# Patient Record
Sex: Female | Born: 1975 | Race: Black or African American | Hispanic: No | Marital: Single | Smoking: Never smoker
Health system: Southern US, Community
[De-identification: ages and names within clinical notes are randomized; demographics above are authoritative.]

---

## 1998-01-03 ENCOUNTER — Encounter (HOSPITAL_COMMUNITY): Admission: RE | Admit: 1998-01-03 | Discharge: 1998-04-03 | Payer: Self-pay

## 1999-05-27 ENCOUNTER — Ambulatory Visit (HOSPITAL_COMMUNITY): Admission: RE | Admit: 1999-05-27 | Discharge: 1999-05-27 | Payer: Self-pay | Admitting: Obstetrics and Gynecology

## 1999-05-27 ENCOUNTER — Encounter: Payer: Self-pay | Admitting: Obstetrics and Gynecology

## 2000-01-16 ENCOUNTER — Other Ambulatory Visit: Admission: RE | Admit: 2000-01-16 | Discharge: 2000-01-16 | Payer: Self-pay | Admitting: Obstetrics and Gynecology

## 2000-03-16 ENCOUNTER — Ambulatory Visit (HOSPITAL_COMMUNITY): Admission: RE | Admit: 2000-03-16 | Discharge: 2000-03-16 | Payer: Self-pay | Admitting: Obstetrics and Gynecology

## 2000-03-16 ENCOUNTER — Encounter: Payer: Self-pay | Admitting: *Deleted

## 2000-03-19 ENCOUNTER — Ambulatory Visit (HOSPITAL_COMMUNITY): Admission: RE | Admit: 2000-03-19 | Discharge: 2000-03-19 | Payer: Self-pay | Admitting: Obstetrics and Gynecology

## 2000-05-31 ENCOUNTER — Encounter (INDEPENDENT_AMBULATORY_CARE_PROVIDER_SITE_OTHER): Payer: Self-pay

## 2000-05-31 ENCOUNTER — Inpatient Hospital Stay (HOSPITAL_COMMUNITY): Admission: AD | Admit: 2000-05-31 | Discharge: 2000-06-06 | Payer: Self-pay | Admitting: Obstetrics and Gynecology

## 2000-06-01 ENCOUNTER — Encounter: Payer: Self-pay | Admitting: Obstetrics and Gynecology

## 2000-06-02 ENCOUNTER — Encounter: Payer: Self-pay | Admitting: Obstetrics and Gynecology

## 2000-06-07 ENCOUNTER — Encounter: Admission: RE | Admit: 2000-06-07 | Discharge: 2000-08-23 | Payer: Self-pay | Admitting: Obstetrics and Gynecology

## 2000-10-07 ENCOUNTER — Ambulatory Visit (HOSPITAL_COMMUNITY): Admission: RE | Admit: 2000-10-07 | Discharge: 2000-10-07 | Payer: Self-pay | Admitting: Obstetrics and Gynecology

## 2000-10-07 ENCOUNTER — Encounter: Payer: Self-pay | Admitting: Obstetrics and Gynecology

## 2002-03-09 ENCOUNTER — Other Ambulatory Visit: Admission: RE | Admit: 2002-03-09 | Discharge: 2002-03-09 | Payer: Self-pay | Admitting: Obstetrics and Gynecology

## 2003-10-18 ENCOUNTER — Other Ambulatory Visit: Admission: RE | Admit: 2003-10-18 | Discharge: 2003-10-18 | Payer: Self-pay | Admitting: Obstetrics and Gynecology

## 2005-01-23 ENCOUNTER — Other Ambulatory Visit: Admission: RE | Admit: 2005-01-23 | Discharge: 2005-01-23 | Payer: Self-pay | Admitting: Obstetrics and Gynecology

## 2006-03-03 ENCOUNTER — Other Ambulatory Visit: Admission: RE | Admit: 2006-03-03 | Discharge: 2006-03-03 | Payer: Self-pay | Admitting: Obstetrics and Gynecology

## 2006-09-09 ENCOUNTER — Emergency Department (HOSPITAL_COMMUNITY): Admission: EM | Admit: 2006-09-09 | Discharge: 2006-09-09 | Payer: Self-pay | Admitting: Family Medicine

## 2007-06-03 ENCOUNTER — Emergency Department (HOSPITAL_COMMUNITY): Admission: EM | Admit: 2007-06-03 | Discharge: 2007-06-03 | Payer: Self-pay | Admitting: Emergency Medicine

## 2007-07-19 ENCOUNTER — Emergency Department (HOSPITAL_COMMUNITY): Admission: EM | Admit: 2007-07-19 | Discharge: 2007-07-19 | Payer: Self-pay | Admitting: Family Medicine

## 2008-03-02 ENCOUNTER — Emergency Department (HOSPITAL_COMMUNITY): Admission: EM | Admit: 2008-03-02 | Discharge: 2008-03-02 | Payer: Self-pay | Admitting: Family Medicine

## 2009-09-30 ENCOUNTER — Ambulatory Visit (HOSPITAL_COMMUNITY): Admission: RE | Admit: 2009-09-30 | Discharge: 2009-09-30 | Payer: Self-pay | Admitting: Obstetrics and Gynecology

## 2010-01-15 ENCOUNTER — Encounter: Admission: RE | Admit: 2010-01-15 | Discharge: 2010-01-15 | Payer: Self-pay | Admitting: Obstetrics and Gynecology

## 2010-02-08 ENCOUNTER — Emergency Department (HOSPITAL_COMMUNITY): Admission: EM | Admit: 2010-02-08 | Discharge: 2010-02-08 | Payer: Self-pay | Admitting: Family Medicine

## 2010-03-18 ENCOUNTER — Other Ambulatory Visit: Payer: Self-pay | Admitting: Obstetrics and Gynecology

## 2010-03-19 ENCOUNTER — Encounter (INDEPENDENT_AMBULATORY_CARE_PROVIDER_SITE_OTHER): Payer: Self-pay | Admitting: Obstetrics and Gynecology

## 2010-03-19 ENCOUNTER — Inpatient Hospital Stay (HOSPITAL_COMMUNITY): Admission: AD | Admit: 2010-03-19 | Discharge: 2010-03-22 | Payer: Self-pay | Admitting: Obstetrics and Gynecology

## 2010-03-22 ENCOUNTER — Encounter: Admission: RE | Admit: 2010-03-22 | Discharge: 2010-04-21 | Payer: Self-pay | Admitting: Obstetrics and Gynecology

## 2010-04-01 ENCOUNTER — Ambulatory Visit: Admission: RE | Admit: 2010-04-01 | Discharge: 2010-04-01 | Payer: Self-pay | Admitting: Obstetrics and Gynecology

## 2010-04-22 ENCOUNTER — Encounter: Admission: RE | Admit: 2010-04-22 | Discharge: 2010-05-06 | Payer: Self-pay | Admitting: Obstetrics and Gynecology

## 2010-05-23 ENCOUNTER — Encounter: Admission: RE | Admit: 2010-05-23 | Discharge: 2010-06-22 | Payer: Self-pay | Admitting: Obstetrics and Gynecology

## 2010-06-23 ENCOUNTER — Encounter
Admission: RE | Admit: 2010-06-23 | Discharge: 2010-07-23 | Payer: Self-pay | Source: Home / Self Care | Admitting: Obstetrics and Gynecology

## 2010-07-24 ENCOUNTER — Encounter
Admission: RE | Admit: 2010-07-24 | Discharge: 2010-08-23 | Payer: Self-pay | Source: Home / Self Care | Attending: Obstetrics and Gynecology | Admitting: Obstetrics and Gynecology

## 2010-08-24 ENCOUNTER — Encounter
Admission: RE | Admit: 2010-08-24 | Discharge: 2010-09-16 | Payer: Self-pay | Source: Home / Self Care | Attending: Obstetrics and Gynecology | Admitting: Obstetrics and Gynecology

## 2010-09-24 ENCOUNTER — Encounter (HOSPITAL_COMMUNITY)
Admission: RE | Admit: 2010-09-24 | Discharge: 2010-09-24 | Disposition: A | Discharge status: Home / Self Care | Payer: Self-pay | Source: Ambulatory Visit | Attending: Obstetrics and Gynecology | Admitting: Obstetrics and Gynecology

## 2010-09-24 DIAGNOSIS — O923 Agalactia: Secondary | ICD-10-CM | POA: Insufficient documentation

## 2010-10-31 LAB — SURGICAL PCR SCREEN
MRSA, PCR: NEGATIVE
Staphylococcus aureus: POSITIVE — AB

## 2010-10-31 LAB — CBC
HCT: 32.3 % — ABNORMAL LOW (ref 36.0–46.0)
HCT: 42.1 % (ref 36.0–46.0)
HCT: 44.2 % (ref 36.0–46.0)
Hemoglobin: 10.8 g/dL — ABNORMAL LOW (ref 12.0–15.0)
Hemoglobin: 14.5 g/dL (ref 12.0–15.0)
MCHC: 32.8 g/dL (ref 30.0–36.0)
MCV: 90.6 fL (ref 78.0–100.0)
MCV: 90.7 fL (ref 78.0–100.0)
RBC: 4.64 MIL/uL (ref 3.87–5.11)
RDW: 15.2 % (ref 11.5–15.5)
RDW: 15.7 % — ABNORMAL HIGH (ref 11.5–15.5)
WBC: 5.1 10*3/uL (ref 4.0–10.5)
WBC: 5.7 10*3/uL (ref 4.0–10.5)
WBC: 8.4 10*3/uL (ref 4.0–10.5)

## 2010-10-31 LAB — BASIC METABOLIC PANEL
Chloride: 110 mEq/L (ref 96–112)
GFR calc Af Amer: >60 mL/min (ref >60)
GFR calc non Af Amer: >60 mL/min (ref >60)
Potassium: 3.7 mEq/L (ref 3.5–5.1)
Sodium: 136 mEq/L (ref 135–145)

## 2010-10-31 LAB — TYPE AND SCREEN
ABO/RH(D): B POS
Antibody Screen: NEGATIVE

## 2010-10-31 LAB — GLUCOSE, CAPILLARY
Glucose-Capillary: 72 mg/dL (ref 70–99)
Glucose-Capillary: 73 mg/dL (ref 70–99)

## 2010-10-31 LAB — RPR: RPR Ser Ql: NONREACTIVE

## 2010-11-05 LAB — CBC
Hemoglobin: 13.6 g/dL (ref 12.0–15.0)
MCHC: 32.3 g/dL (ref 30.0–36.0)
MCV: 90.7 fL (ref 78.0–100.0)
RBC: 4.65 MIL/uL (ref 3.87–5.11)
RDW: 15.6 % — ABNORMAL HIGH (ref 11.5–15.5)

## 2010-11-25 ENCOUNTER — Encounter (HOSPITAL_COMMUNITY)
Admission: RE | Admit: 2010-11-25 | Discharge: 2010-11-25 | Disposition: A | Discharge status: Home / Self Care | Payer: Self-pay | Source: Ambulatory Visit | Attending: Obstetrics and Gynecology | Admitting: Obstetrics and Gynecology

## 2010-11-25 DIAGNOSIS — O923 Agalactia: Secondary | ICD-10-CM | POA: Insufficient documentation

## 2011-01-02 NOTE — H&P (Signed)
[facility]  Patient:    Brittany Hinton, Brittany Hinton                        MRN: [id number] Adm. Date:  03/19/00 Attending:  Alvino Chapel, M.D.                         History and Physical  HISTORY OF PRESENT ILLNESS:   The patient is a 35 year old, G1, P0, at [redacted] weeks gestation, who is scheduled for a cervical cerclage given findings of a funneling cervix on sonogram and a vaginal examination consistent with a mildly incompetent cervix.  The patient began prenatal care at eight weeks gestation and had an uncomplicated pregnancy course that is essential this time except for a abnormal alpha-fetoprotein which had a slightly increased risk of Down syndrome.  PERTINENT LABORATORY DATA:    B positive.  Antibody negative.  RPR nonreactive. Rubella-immune.  Hepatitis B surface antigen negative.  HIV negative.  GC negative.  Chlamydia negative.  PAST MEDICAL HISTORY:         Significant only for a history of depression which requires no medications at this point.  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  PAST GYNECOLOGIC HISTORY:     Noncontributory.  PAST OBSTETRICAL HISTORY:     None.  PAST SURGICAL HISTORY:        None.  ALLERGIES:                    The patient has no known drug allergies.  CURRENT MEDICATIONS:          She is currently on her prenatal vitamins only.  PHYSICAL EXAMINATION:  VITAL SIGNS:                  On physical examination the patient is 251 pounds, blood pressure is 100/70.  HEART:                        Regular rate and rhythm.  Fundal height is consistent with [redacted] weeks gestation on sonogram.  PELVIC:                       Again, the patients cervix is 4.5 cm which funnels to the external os with pressure.  On vaginal examination the cervix is approximately 2 cm long with a fibroid uterine segment.  DISPOSITION:                  The patient was counseled for the  possible incompetent cervix and options were discussed including observation versus cervical cerclage.  The risks and benefits of the cerclage were discussed with patient including possible rupture of membranes and the patient agrees to proceed with the procedure. DD:  03/18/00 TD:  03/18/00 Job: [id number]

## 2011-01-02 NOTE — Discharge Summary (Signed)
[facility]  Patient:    Brittany Hinton, Brittany Hinton                      MRN: [id number] Adm. Date:  [id number] Disc. Date: [id number] Attending:  Michaele Offer                           Discharge Summary  DISCHARGE DIAGNOSES:          1. Preterm pregnancy at 29 weeks, delivered.                               2. Severe preeclampsia.                               3. Nonreassuring fetal status.                               4. Status post primary low transverse cesarean                                  section that was not in the lower uterine                                  segment.  DISCHARGE MEDICATIONS:        1. Percocet one to two tablets p.o. every four                                  hours p.r.n.                               2. Motrin 600 mg p.o. every six hours p.r.n.  DISCHARGE FOLLOW-UP:          The patient is to follow up in our office in approximately two weeks for an incision check.  In addition, she was given consultations from NICU social service regarding NICU visits for her baby, as well as instruction on pumping by Advertising copywriter.  HISTORY OF PRESENT ILLNESS:   The patient is a 35 year old, G1, P0, who was admitted at 28+ weeks by an 8-week ultrasound after she was seen in the office for a scheduled visit and complained of mild headache, nausea, and increased edema.  The patient reported that she had not felt very well for approximately one week, however, denied specific symptoms of right upper quadrant or epigastric pain.  She had good fetal movement.  No vaginal bleeding or rupture of membranes.  The blood pressure in the office was 140-160/110.  She had 3+ proteinuria and a 17-pound weight gain in two weeks.  Prenatal care had been previously complicated by a 1:229 risk of trisomy 17.  The patient declined amniocentesis with a normal ultrasound.  She was also noted to have a possible incompetent cervix early in pregnancy  by 16-17 week ultrasound, which indeed on palpation felt quite effaced.  Therefore, she was treated with a cerclage which was placed at 18 weeks.  PRENATAL LABORATORIES:  B+.  Sickle negative.  RPR nonreactive.  Rubella immune.  Hepatitis B surface antigen negative.  HIV negative.  GC negative. Chlamydia negative.  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  She has had no recent chest x-ray.  History of depression.  PAST SURGICAL HISTORY:        The patient had a cerclage placed as stated previously.  PAST OBSTETRICAL HISTORY:     None.  PAST GYNECOLOGICAL HISTORY:   None.  ALLERGIES:                    She has no known drug allergies.  MEDICATIONS:                  She was on no medications.  SOCIAL SITUATION:             The patient is single, however, the father of the baby was involved and living in [place], [place].  She is not a smoker.  PHYSICAL EXAMINATION ON ADMISSION:                    The patients blood pressure was 140-160/80-110. Her other vital signs were stable.  The fetal heart rate had been difficult to follow, however, did appear to have good variability with occasional accelerations.  HEART:                        Regular rate and rhythm.  LUNGS:                        Clear.  ABDOMEN:                      Gravid and nontender.  The fundal height was 31 cm.  EXTREMITIES:                  She had 3+ edema.  DTRs were 3/4 with no clonus.  PELVIC:                       On vaginal exam, her sutures were intact in the cervix with os closed.  LABORATORY DATA:              Her preeclamptic labs on admission were normal. The hematocrit was 38.6 and platelets were 368.  The SGOT was 30 and the SGPT was 26.  Her uric acid was abnormal at 8.5.  Her creatinine was slightly elevated at 1.0  HOSPITAL COURSE:              The patient was admitted for observation and betamethasone.  She received  betamethasone and two doses 24 hours apart and began a 24-hour urine.  She remained on continuous monitoring.  The plan was also  made to have an ultrasound to rule out IUGR and oligohydramnios.  On the evening of admission, the patient was noted to have a baseline fetal heart rate of approximately 150 with good variability.  There were no accelerations or significant decelerations, however, the baby did have some occasional variable decelerations previous to her examination that evening.  The variability remained reassuring.  Therefore, the patient was continued on an observation status.  On hospital day #2, the patient remained stable with no significant preeclamptic symptoms.  The fetal heart rate remained much unchanged with  good variability and occasional variable decelerations.  Her labs were unchanged with an increased creatinine and uric acid.  Blood pressures were stable after admission from 130-140/70-80 on bed rest.  The patient did have a noted low urine output, which had responded partially to boluses.  As stated, she had little p.o. intake for the previous 48 hours prior to admission.  Therefore, she was continued to be observed with an increase in her fluid and change of her IV fluids to D5 1/2 normal saline.  On the evening of hospital day #2, blood pressures remained stable at 114-140/70-74.  The fetal heart rate continued to have good variability with only occasional variable decelerations.  The blood pressure was stable.  The patient received her second betamethasone dose.  Urine output remained diminished at approximately 80 cc for five hours.  A Foley catheter was then placed and the patient was given a 500 cc bolus.  An ultrasound on this day revealed a fetus that was questionably growth restricted, however, had normal Dopplers and normal amniotic fluid.  The patient was begun on magnesium sulfate after her diminished urine output considered to confirm  pre-eclamptic diagnosis.  Urine output responded well to bolus with adequate output in the 40-50 cc range an hour.  The patient was given a magnesium bolus and the plan  was made to check a magnesium level four hours after bolus given her decreased urine output.  The decision was made with the patient that should her urine output drop again we would proceed with labor induction versus cesarean section.  The fetal heart rate remained relatively reassuring with good variability at this point.  On hospital day #3, the patient remained stable with adequate urine output.  Her labs were essentially unchanged.  In the p.m. of that evening, the patient reported decreased fetal movement and the fetal heart rate was examined with decrease in variability noted and occasional decelerations.  A fetal acoustic stem was provided and the patient was noted to have no response with the fetal heart acceleration at this point.  After careful discussion with the patient and the father of the baby, the preeclamptic diagnosis was discussed.  At this point, the baby had received 24 hours of betamethasone x 2 doses.  Labs revealed a 24-hour urine protein of 17 g.  Therefore, the decision was made to proceed with delivery.  Sutures were removed from the cervix without difficulty and the vaginal exam at this time remained closed, 2 cm, and high.  Given the relative inability to confirm fetal well being, the decision was made to proceed with a cesarean section versus any attempt at labor induction.  The patient underwent a primary transverse cesarean section which was higher than the lower uterine segment. She was delivered of a viable female infant.  Apgars were 1, 5, and 7.  Weight was 873 g.  The baby was taken to neonatal intensive care for premature status.  The patient tolerated the cesarean section and was admitted for routine postpartum care.  She was continued on her magnesium as her urine output was  continually sluggish.  Her postoperative hematocrit was 35.6. Other preeclamptic labs remained stable.  Blood pressures were 130s-150s/60-91.  On postoperative day #2, felt much better.  She had some blurry vision, but otherwise no other preeclamptic symptoms.  She had no nausea and vomiting, was passing flatus, and was able to tolerate a regular diet.  Blood pressures remained slightly borderline at 150-169/75-96.  She had begun to have  significant diuresis with approximately 2 L in eight hours. Therefore, the decision was made to discontinue her magnesium as she was greater than 24 hours from delivery and diuresing well.  She continued her postoperative recovery without difficulty.  She began breast pumping with the assistance of lactation consultants.  Her blood pressure remained borderline at most with systolics in the 140s-150s over diastolics in the 70s to low 90s. The patients incision was well approximated with Steri-Strips and had no erythema or exudate.  On postoperative day #4, she was discharged to home to follow up in approximately two weeks for an incision check. DD:  06/26/00 TD:  06/27/00 Job: [id number]

## 2011-01-02 NOTE — Op Note (Signed)
[facility]  Patient:    Brittany Hinton, Brittany Hinton                      MRN: [id number] Proc. Date: 03/19/00 Adm. Date:  [id number] Disc. Date: [id number] Attending:  Oliver Pila                           Operative Report  PREOPERATIVE DIAGNOSIS: 1. Incompetent cervix. 2. Intrauterine gestation at approximately [redacted] weeks gestation.  POSTOPERATIVE DIAGNOSIS: 1. Incompetent cervix. 2. Intrauterine gestation at approximately [redacted] weeks gestation.  OPERATION:  McDonald cerclage x 2.  SURGEON:  Alvino Chapel, M.D.  ASSISTANT:  Zenaida Niece, M.D.  ANESTHESIA:  Spinal.  ESTIMATED BLOOD LOSS:  50 cc.  URINE OUTPUT:  Approximately 50 cc straight cathed prior to procedure and IV fluids approximately 600 cc lactated Ringers.  DESCRIPTION OF PROCEDURE:  The patient was taken to the operating room where spinal anesthesia was obtained without difficulty.  She was then prepped and draped in normal sterile fashion in the dorsal supine position.  A weighted speculum was then placed in the vagina and the cervix identified with the anterior lip grasped with a ring forcep and at 12 oclock position, the #2 Polydek suture was used in a clockwise fashion to suture around the cervix circumferentially.  This was brought back around clockwise to the 12 oclock position and then tied down with good results.  With gentle traction placed on this suture, a second McDonald cerclage was placed approximately 1 cm back on the cervix in a similar fashion beginning at 12 oclock and carried around in a clockwise fashion.  This was also tied down with good result.  There was a small amount of bleeding which was stopped after the suture was tied down. There was no leakage of fluid noted at this point and the patient was therefore taken out of dorsal lithotomy position and all instruments were removed from the vagina and the patient was taken to the recovery room in stable  condition. DD:  03/19/00 TD:  03/22/00 Job: [id number]

## 2011-01-02 NOTE — Op Note (Signed)
[facility]  Patient:    Brittany Hinton, Brittany Hinton                      MRN: [id number] Proc. Date: 06/02/00 Adm. Date:  [id number] Attending:  Michaele Offer                           Operative Report  PREOPERATIVE DIAGNOSES:       1. Intrauterine pregnancy at 29 weeks.                               2. Severe preeclampsia.                               3. Nonreassuring fetal status.  POSTOPERATIVE DIAGNOSES:      1. Intrauterine pregnancy at 29 weeks.                               2. Severe preeclampsia.                               3. Nonreassuring fetal status.                               4. Intrauterine growth restriction.  OPERATION:                    Primary transverse cesarean section that was                               not in the lower uterine segment.  SURGEON:                      Zenaida Niece, M.D.  ASSISTANT:  ANESTHESIA:                   Spinal anesthesia.  ANESTHESIOLOGIST:             Ellison Hughs., M.D.  ESTIMATED BLOOD LOSS:         500 cc.  CHEMOPROPHYLAXIS:             Ancef 1 g after cord clamp.  FINDINGS:                     The patient had normal anatomy and delivered a viable female infant with Apgars of 1, 5 and 7 and weighed 873 g.  The neonatal team was in attendance at delivery.  COUNTS:                       Correct.  CONDITION:                    Stable.  DESCRIPTION OF PROCEDURE:     After appropriate informed consent was obtained, the patient was taken to the operating room and placed in a sitting position. Dr. Arby Barrette instilled spinal anesthesia and she was placed in the dorsal supine position with left lateral tilt.  Her abdomen was prepped and draped in the usual sterile fashion and the level  of her anesthesia was found to be adequate.  Her abdomen was then entered via standard Pfannenstiel incision through a quite thick panniculus.  Once the peritoneal cavity was entered,  it was very difficult to reach the lower uterine segment just due to the patients anatomy and the small size of the uterus.  I did attempt to incise the vesicouterine peritoneum and create a bladder flap but it was much too low in the pelvis.  I was able to then incise the uterus in a transverse fashion probably just above the lower uterine segment.  Once the uterine cavity was entered, the incision was extended bilaterally digitally.  Membranes were ruptured and clear amniotic fluid was noted.  The fetal vertex was then grasped and delivered through the incision atraumatically.  Nuchal cord x 2 was reduced and the mouth was suctioned.  The remainder of the infant then delivered atraumatically.  The cord was doubly clamped and cut and the infant handed to the awaiting pediatric team.  Cord blood and a cord arterial pH were obtained.  The uterus was then wiped dry with a clean lap pad and almost immediately shrank down to normal uterine size. With some difficulty the uterine incision was inspected and found to be free of extensions.  It was closed with one layer being running locking layer with #1 chromic with adequate hemostasis.  Again, it did appear that this incision is just above the lower uterine segment.  The paracolic gutters were then blotted and all clots and debris were removed. This was made difficult by the bowels protruding into the operative site.  The uterine incision was again inspected and found to be hemostatic.  Both tubes and ovaries were inspected and found to be normal.  The rectus muscles were then reapproximated in the midline with interrupted sutures of #1 chromic just to help retain the bowels inside the abdominal cavity.  The subfascial space was then made hemostatic with electrocautery.  The fascia was closed in a running fashion starting at both ends and meeting in the middle with 0 Vicryl. The subcutaneous tissue was then irrigated and made hemostatic with  electrocautery.  The subcutaneous tissue was then closed with a running suture of 2-0 plain gut suture.  The skin was then closed with staples and a sterile dressing.  The patient tolerated the procedure well with increasing urine output by the end of the procedure and a stable blood pressure that was 190s/110s at the beginning and 170s/90s at the end. DD:  06/02/00 TD:  06/03/00 Job: [id number]

## 2011-05-15 LAB — URINE MICROSCOPIC-ADD ON

## 2011-05-15 LAB — CBC
Hemoglobin: 13.3
MCHC: 33
RBC: 4.53
WBC: 8.3

## 2011-05-15 LAB — DIFFERENTIAL
Basophils Relative: 1
Lymphocytes Relative: 27
Monocytes Absolute: 0.6
Monocytes Relative: 8
Neutro Abs: 5
Neutrophils Relative %: 61

## 2011-05-15 LAB — PREGNANCY, URINE: Preg Test, Ur: NEGATIVE

## 2011-05-15 LAB — URINALYSIS, ROUTINE W REFLEX MICROSCOPIC
Bilirubin Urine: NEGATIVE
Glucose, UA: NEGATIVE
Ketones, ur: NEGATIVE
Protein, ur: NEGATIVE
Urobilinogen, UA: 0.2

## 2012-07-25 ENCOUNTER — Ambulatory Visit: Payer: Self-pay | Admitting: Family Medicine

## 2012-08-01 ENCOUNTER — Encounter: Payer: Self-pay | Admitting: Family Medicine

## 2012-09-30 ENCOUNTER — Encounter (HOSPITAL_COMMUNITY): Payer: Self-pay | Admitting: Emergency Medicine

## 2012-09-30 ENCOUNTER — Emergency Department (HOSPITAL_COMMUNITY)
Admission: EM | Admit: 2012-09-30 | Discharge: 2012-09-30 | Disposition: A | Discharge status: Home / Self Care | Payer: 59 | Source: Home / Self Care | Attending: Family Medicine | Admitting: Family Medicine

## 2012-09-30 DIAGNOSIS — T148XXA Other injury of unspecified body region, initial encounter: Secondary | ICD-10-CM

## 2012-09-30 MED ORDER — HYDROCODONE-ACETAMINOPHEN 5-500 MG PO TABS: 1.0000 | ORAL_TABLET | Freq: Three times a day (TID) | ORAL | Status: AC | PRN

## 2012-09-30 MED ORDER — CYCLOBENZAPRINE HCL 10 MG PO TABS: 10.0000 mg | ORAL_TABLET | Freq: Two times a day (BID) | ORAL | Status: AC | PRN

## 2012-09-30 MED ORDER — IBUPROFEN 600 MG PO TABS: 600.0000 mg | ORAL_TABLET | Freq: Three times a day (TID) | ORAL | Status: AC | PRN

## 2012-09-30 NOTE — ED Notes (Signed)
Pt c/o right sided back pain. Pt states that on Wednesday morning she fell down the ramp at day care. Pt has been taking ibuprofen with no relief of pain.

## 2012-09-30 NOTE — ED Notes (Signed)
Waiting discharge papers 

## 2012-10-05 NOTE — ED Provider Notes (Signed)
History     CSN: [id number]  Arrival date & time 09/30/12  1622   First MD Initiated Contact with Patient 09/30/12 1623      Chief Complaint  Patient presents with  . Back Pain    fell wednesday morning on right side walking down a ramp at daycare    (Consider location/radiation/quality/duration/timing/severity/associated sxs/prior treatment) HPI Comments: 37 y/o female obese here c/o right side back pain for 3 days. Patient states she slipped on salt at a daycare ramp and was able to grab herself to the doorknob on her way down which prevented that she landed abruptly in the floor. States she still contacted the floor with her right knee and thigh but her lower extremity is not hurting and does not have any bruising or skin brakes. Pain in back is worse with right arm movements. Denies hitting her head.    History reviewed. No pertinent past medical history.  History reviewed. No pertinent past surgical history.  History reviewed. No pertinent family history.  History  Substance Use Topics  . Smoking status: Never Smoker   . Smokeless tobacco: Not on file  . Alcohol Use: Not on file     Comment: occasional    OB History   Grav Para Term Preterm Abortions TAB SAB Ect Mult Living                  Review of Systems  Respiratory: Negative for cough.   Cardiovascular: Negative for chest pain.  Gastrointestinal: Negative for abdominal pain.  Genitourinary: Negative for pelvic pain.  Musculoskeletal: Positive for back pain.       As per HPI  Skin: Negative for rash and wound.  Neurological: Negative for dizziness, weakness, numbness and headaches.  All other systems reviewed and are negative.    Allergies  Review of patient's allergies indicates no known allergies.  Home Medications   Current Outpatient Rx  Name  Route  Sig  Dispense  Refill  . cyclobenzaprine (FLEXERIL) 10 MG tablet   Oral   Take 1 tablet (10 mg total) by mouth 2 (two) times daily as needed for  muscle spasms.   20 tablet   0   . HYDROcodone-acetaminophen (VICODIN) 5-500 MG per tablet   Oral   Take 1 tablet by mouth every 8 (eight) hours as needed for pain.   20 tablet   0   . ibuprofen (ADVIL,MOTRIN) 600 MG tablet   Oral   Take 1 tablet (600 mg total) by mouth every 8 (eight) hours as needed for pain.   20 tablet   0     BP 124/82  Pulse 110  Temp(Src) 98.3 F (36.8 C) (Oral)  Resp 22  SpO2 98%  LMP 09/23/2012  Physical Exam  Nursing note and vitals reviewed. Constitutional: She is oriented to person, place, and time. She appears well-developed and well-nourished. No distress.  HENT:  Head: Normocephalic and atraumatic.  Neck: Neck supple.  Cardiovascular: Normal heart sounds.   Pulmonary/Chest: Breath sounds normal.  Abdominal: Soft. There is no tenderness.  Musculoskeletal:  Tenderness to palpation and increased tone of muscles lateral to right scapula and in mid right para vertebral area. Scapula rotated symmetrically but reported pain increase with right arm abduction.  Also discomfort with palpation over right trapezium muscle.   Neurological: She is alert and oriented to person, place, and time.  Skin: No rash noted. She is not diaphoretic.  No bruising, ecchymosis or hematomas.     ED  Course  Procedures (including critical care time)  Labs Reviewed - No data to display No results found.   1. Muscle strain       MDM  Treated with flexeril, hydrocodone and advil. Supportive care and red flags that should prompt her return discussed with patient and provided in writing.         Sharin Grave, MD 10/05/12 [phone]

## 2016-03-16 DIAGNOSIS — H93A2 Pulsatile tinnitus, left ear: Secondary | ICD-10-CM | POA: Insufficient documentation

## 2016-03-16 DIAGNOSIS — R55 Syncope and collapse: Secondary | ICD-10-CM | POA: Insufficient documentation

## 2016-03-17 ENCOUNTER — Encounter (INDEPENDENT_AMBULATORY_CARE_PROVIDER_SITE_OTHER): Payer: Self-pay

## 2016-03-17 ENCOUNTER — Ambulatory Visit (INDEPENDENT_AMBULATORY_CARE_PROVIDER_SITE_OTHER): Payer: 59

## 2016-03-17 DIAGNOSIS — R55 Syncope and collapse: Secondary | ICD-10-CM | POA: Diagnosis not present

## 2016-03-17 DIAGNOSIS — H93A2 Pulsatile tinnitus, left ear: Secondary | ICD-10-CM

## 2016-05-21 ENCOUNTER — Other Ambulatory Visit: Payer: Self-pay | Admitting: Family Medicine

## 2016-05-21 DIAGNOSIS — H93A2 Pulsatile tinnitus, left ear: Secondary | ICD-10-CM

## 2016-06-13 ENCOUNTER — Ambulatory Visit
Admission: RE | Admit: 2016-06-13 | Discharge: 2016-06-13 | Disposition: A | Discharge status: Home / Self Care | Payer: 59 | Source: Ambulatory Visit | Attending: Family Medicine | Admitting: Family Medicine

## 2016-06-13 DIAGNOSIS — H93A2 Pulsatile tinnitus, left ear: Secondary | ICD-10-CM

## 2017-08-17 IMAGING — MR MR MRA HEAD W/O CM
1 series · 23 of 48 positions shown · non-contrast
Comparison: None.

CLINICAL DATA: Pulsatile tinnitus left ear

EXAM:
MRA HEAD WITHOUT CONTRAST
TECHNIQUE: Angiographic images of the Circle of Willis were obtained using MRA
technique without intravenous contrast.

[Series 3: tof_3d_multi-slab new · axial · 0.7mm · 0.35mm/px · z∈[-46,+47]mm · 23 of 143 slices shown]
[im 1/143]
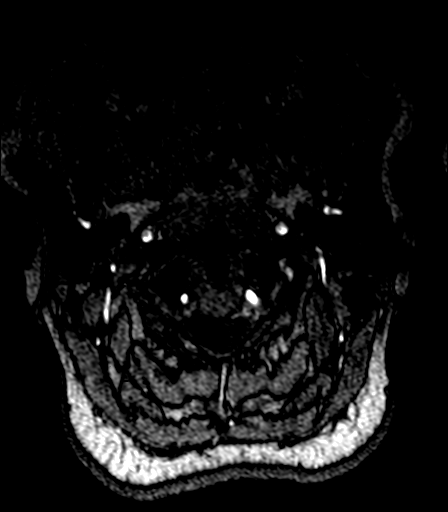
[im 4/143]
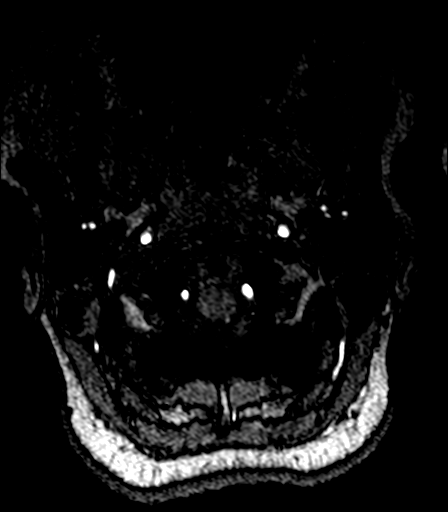
[im 7/143]
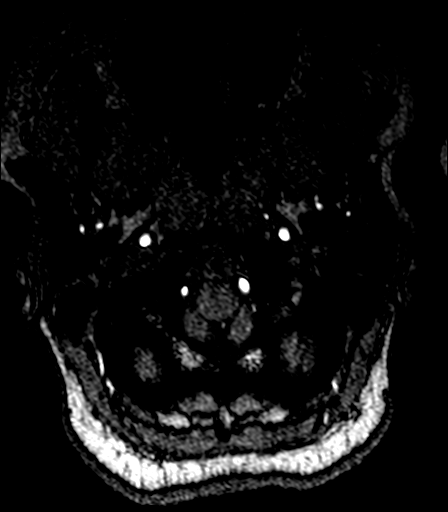
[im 10/143]
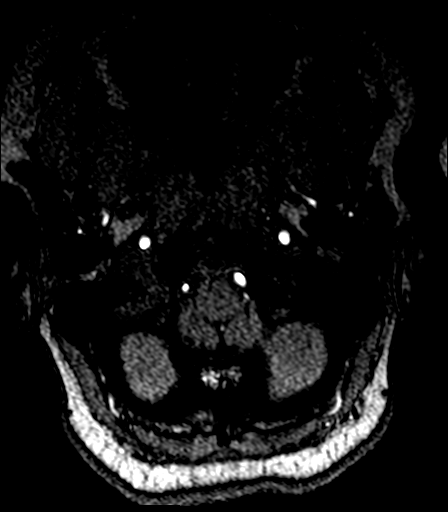
[im 13/143]
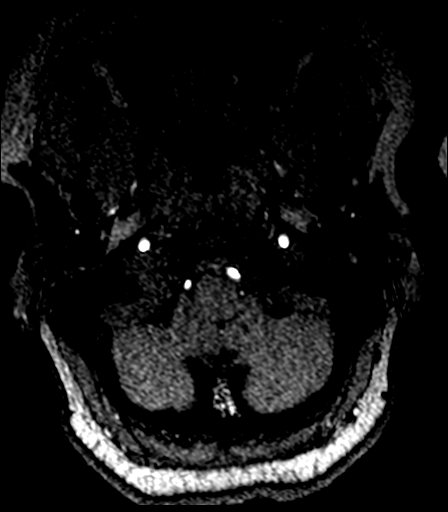
[im 16/143]
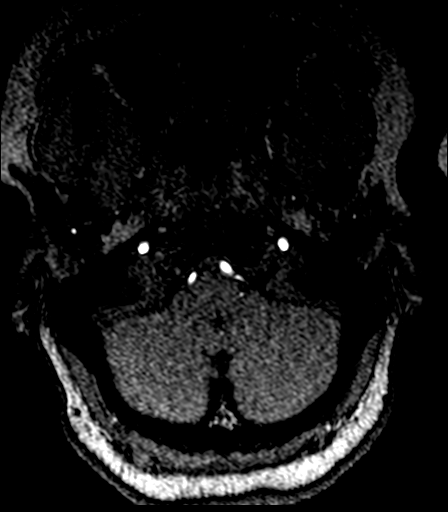
[im 19/143]
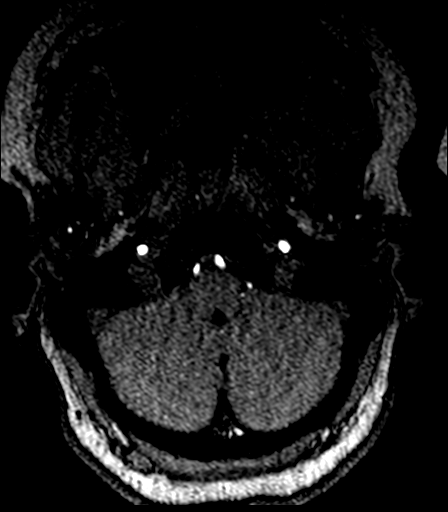
[im 22/143]
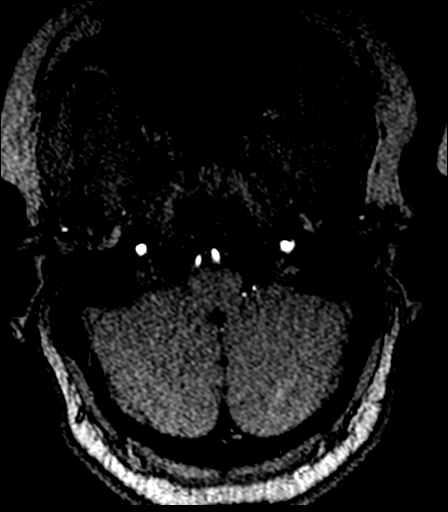
[im 25/143]
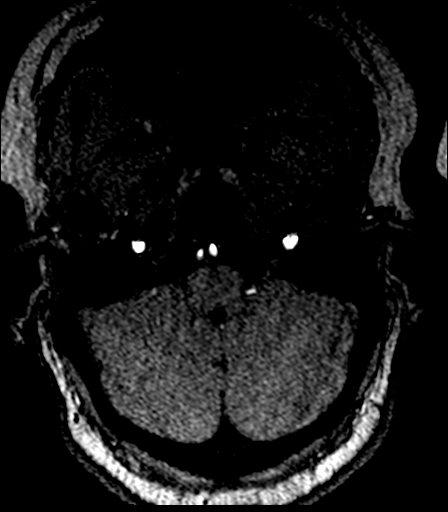
[im 28/143]
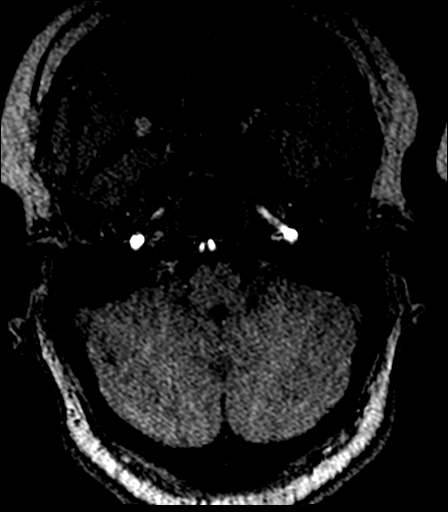
[im 31/143]
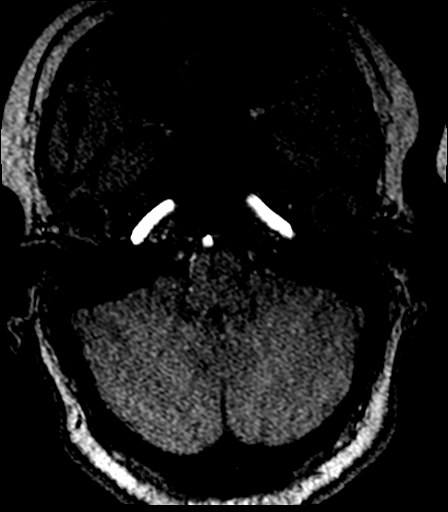
[im 34/143]
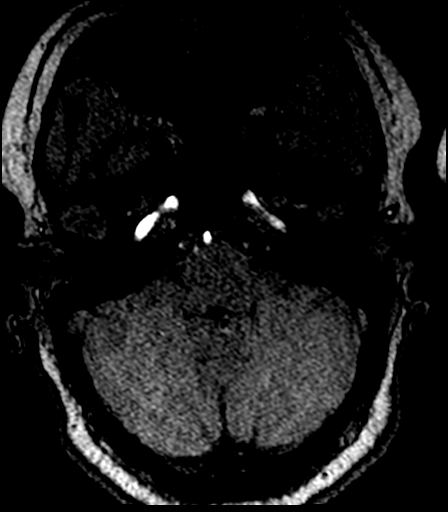
[im 37/143]
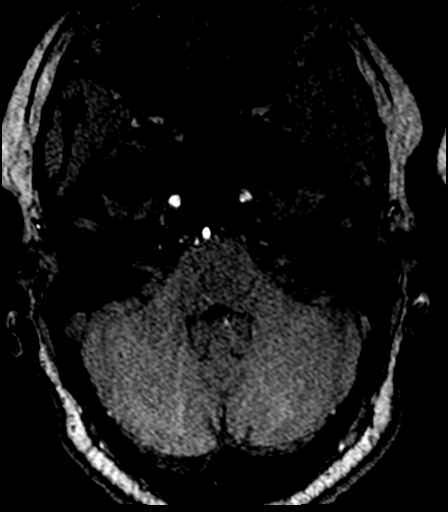
[im 40/143]
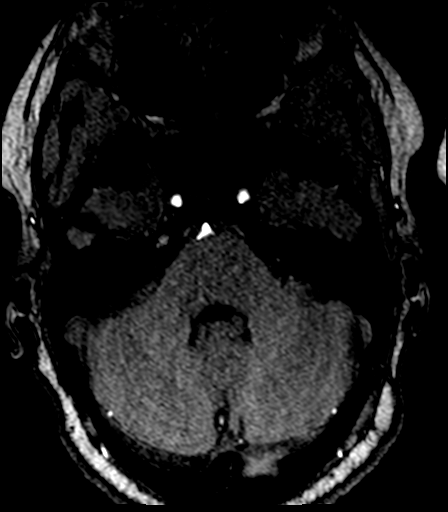
[im 43/143]
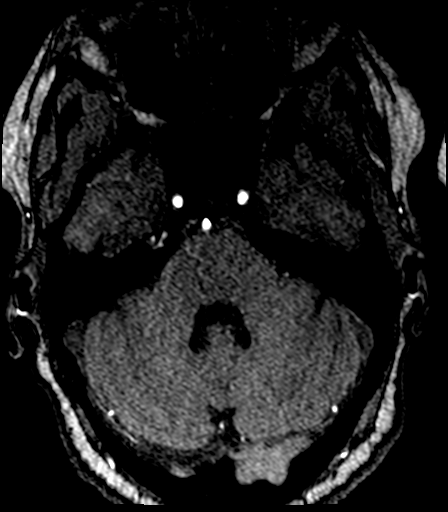
[im 46/143]
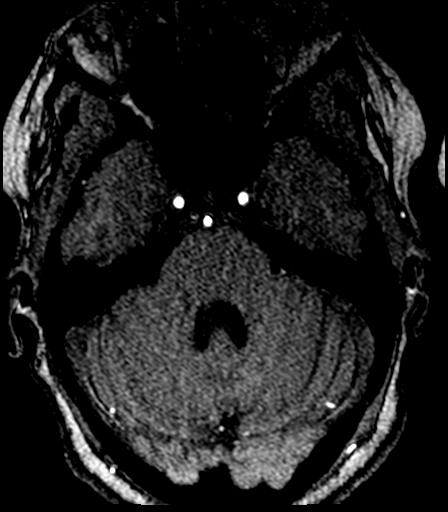
[im 64/143]
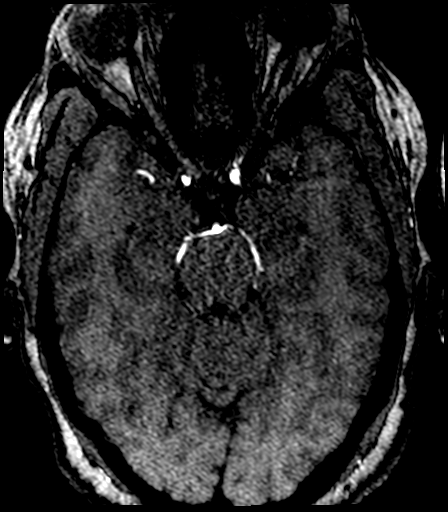
[im 73/143]
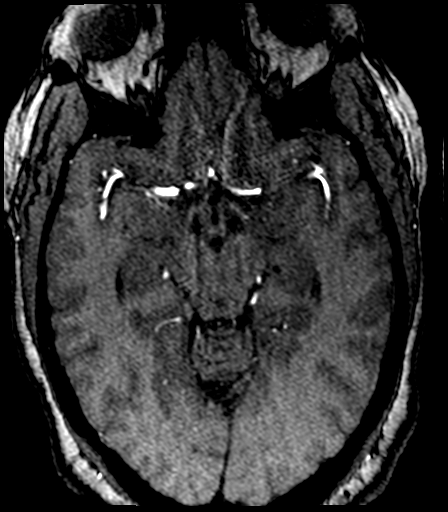
[im 82/143]
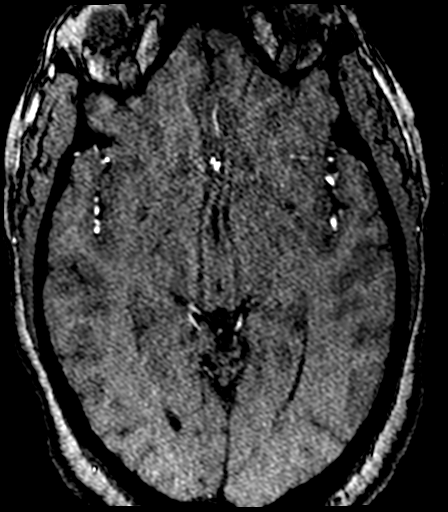
[im 100/143]
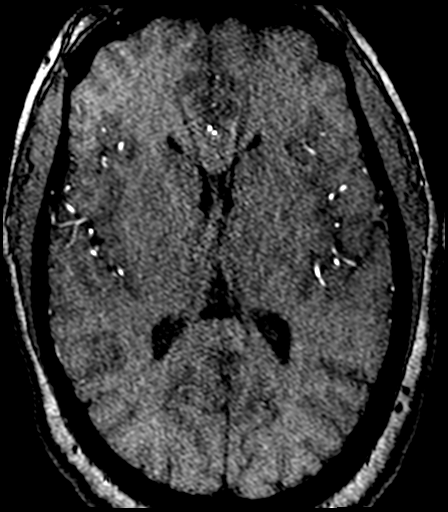
[im 118/143]
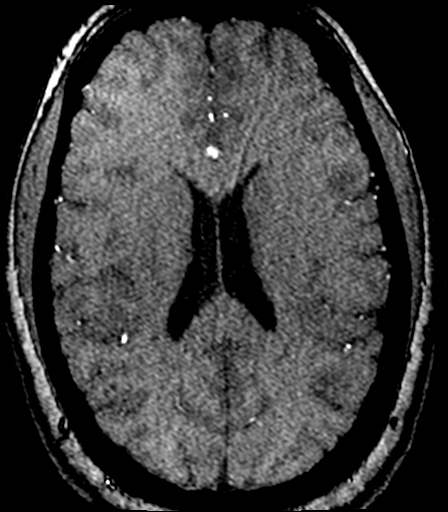
[im 121/143]
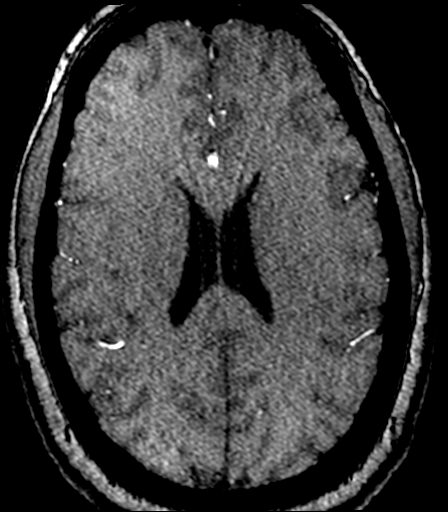
[im 136/143]
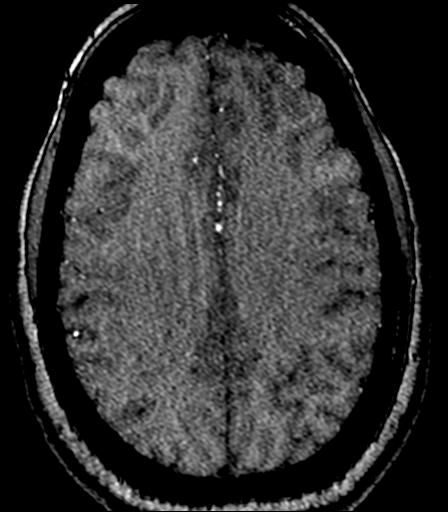

[23 of 48 positions shown; findings below may reference images not displayed]

FINDINGS: Both vertebral arteries patent to the basilar. Left PICA patent.
Right PICA not visualized. Bilateral AICA patent. Basilar widely
patent. Superior cerebellar and posterior cerebral arteries appear
normal

Mild stenosis in the right cavernous carotid. Mild stenosis of the
origin of the parietal and temporal branches of the right MCA. Right
M1 widely patent

Left internal carotid artery widely patent. Left anterior and middle
cerebral arteries widely patent

Negative for cerebral aneurysm.  No vascular malformation
IMPRESSION: No cause for tinnitus identified. Mild stenosis of the right MCA
bifurcation.

## 2019-11-11 ENCOUNTER — Ambulatory Visit: Payer: 59 | Attending: Internal Medicine

## 2019-11-11 DIAGNOSIS — Z23 Encounter for immunization: Secondary | ICD-10-CM

## 2019-11-11 NOTE — Progress Notes (Signed)
   Covid-[facility] Vaccination [facility]  Name:  VICKEY BOAK    MRN: [id number] DOB: Aug 19, 1975  11/11/2019  Ms. Settlemyre was observed post Covid-19 immunization for 15 minutes without incident. She was provided with Vaccine Information Sheet and instruction to access the V-Safe system.   Ms. Bawa was instructed to call 911 with any severe reactions post vaccine: Marland Kitchen Difficulty breathing  . Swelling of face and throat  . A fast heartbeat  . A bad rash all over body  . Dizziness and weakness   Immunizations Administered    Name Date Dose VIS Date Route   Pfizer COVID-19 Vaccine 11/11/2019  9:02 AM 0.3 mL 07/28/2019 Intramuscular   Manufacturer: [employer], [employer]   Lot: [id number]   NDC: [address]-9

## 2019-12-06 ENCOUNTER — Ambulatory Visit: Payer: 59 | Attending: Internal Medicine

## 2019-12-06 DIAGNOSIS — Z23 Encounter for immunization: Secondary | ICD-10-CM

## 2019-12-06 NOTE — Progress Notes (Signed)
   [facility] Vaccination [facility]  Name:  NUSAIBA GUALLPA    MRN: [id number] DOB: [birth year]  12/06/2019  Ms. Gootee was observed post Covid-19 immunization for 15 minutes without incident. She was provided with Vaccine Information Sheet and instruction to access the V-Safe system.   Ms. Heaphy was instructed to call 911 with any severe reactions post vaccine: Marland Kitchen Difficulty breathing  . Swelling of face and throat  . A fast heartbeat  . A bad rash all over body  . Dizziness and weakness   Immunizations Administered    Name Date Dose VIS Date Route   Pfizer COVID-19 Vaccine 12/06/2019  8:36 AM 0.3 mL 10/11/2018 Intramuscular   Manufacturer: [employer], [employer]   Lot: [id number]   NDC: [address]-9

## 2020-03-13 ENCOUNTER — Other Ambulatory Visit: Payer: Self-pay

## 2020-03-13 ENCOUNTER — Ambulatory Visit (LOCAL_COMMUNITY_HEALTH_CENTER): Payer: 59

## 2020-03-13 ENCOUNTER — Ambulatory Visit: Payer: 59

## 2020-03-13 DIAGNOSIS — Z23 Encounter for immunization: Secondary | ICD-10-CM

## 2020-03-13 NOTE — Progress Notes (Signed)
Pt here for Heplisav B #2 and Varicella titer. Pt reports having had chickenpox at age 44. Heplisav B given without difficulty. To lab for Varicella titer. ROI signed. Updated [facility] copy given to pt. Lab reports to RN unable to draw titer today and has scheduled her to return to lab tomorrow 03/14/20 to attempt to redraw varicella titer. Pt in agreement. Jerel Shepherd, RN

## 2020-03-14 ENCOUNTER — Ambulatory Visit (LOCAL_COMMUNITY_HEALTH_CENTER): Payer: Self-pay

## 2020-03-14 DIAGNOSIS — Z0184 Encounter for antibody response examination: Secondary | ICD-10-CM

## 2020-03-14 NOTE — Progress Notes (Signed)
Presents for repeat attempt to obtain Varicella titer this am. Refer to 03/13/2020 documentation for signed ROI information. Jossie Ng, RN

## 2020-03-15 ENCOUNTER — Telehealth: Payer: Self-pay

## 2020-03-15 LAB — VARICELLA ZOSTER ANTIBODY, IGG: Varicella zoster IgG: 983 index (ref >165)

## 2020-03-15 NOTE — Telephone Encounter (Signed)
Varicella titer = 983 and > 165 indicates immunity. Call to client and left message regarding results and copy available for pick up at the [facility] information desk. Number to call for questions provided. Jossie Ng, RN

## 2020-04-19 ENCOUNTER — Other Ambulatory Visit: Payer: Self-pay

## 2020-04-19 ENCOUNTER — Ambulatory Visit
Admission: RE | Admit: 2020-04-19 | Discharge: 2020-04-19 | Disposition: A | Discharge status: Home / Self Care | Payer: 59 | Source: Ambulatory Visit | Attending: Physician Assistant | Admitting: Physician Assistant

## 2020-04-19 ENCOUNTER — Other Ambulatory Visit: Payer: Self-pay | Admitting: Physician Assistant

## 2020-04-19 DIAGNOSIS — R7612 Nonspecific reaction to cell mediated immunity measurement of gamma interferon antigen response without active tuberculosis: Secondary | ICD-10-CM

## 2021-06-23 IMAGING — CR DG CHEST 2V
2 series · 2 of 2 positions shown · non-contrast
Comparison: 03/02/2018

CLINICAL DATA: Positive QuantiFERON test

EXAM:
CHEST - 2 VIEW

[w chest pa]
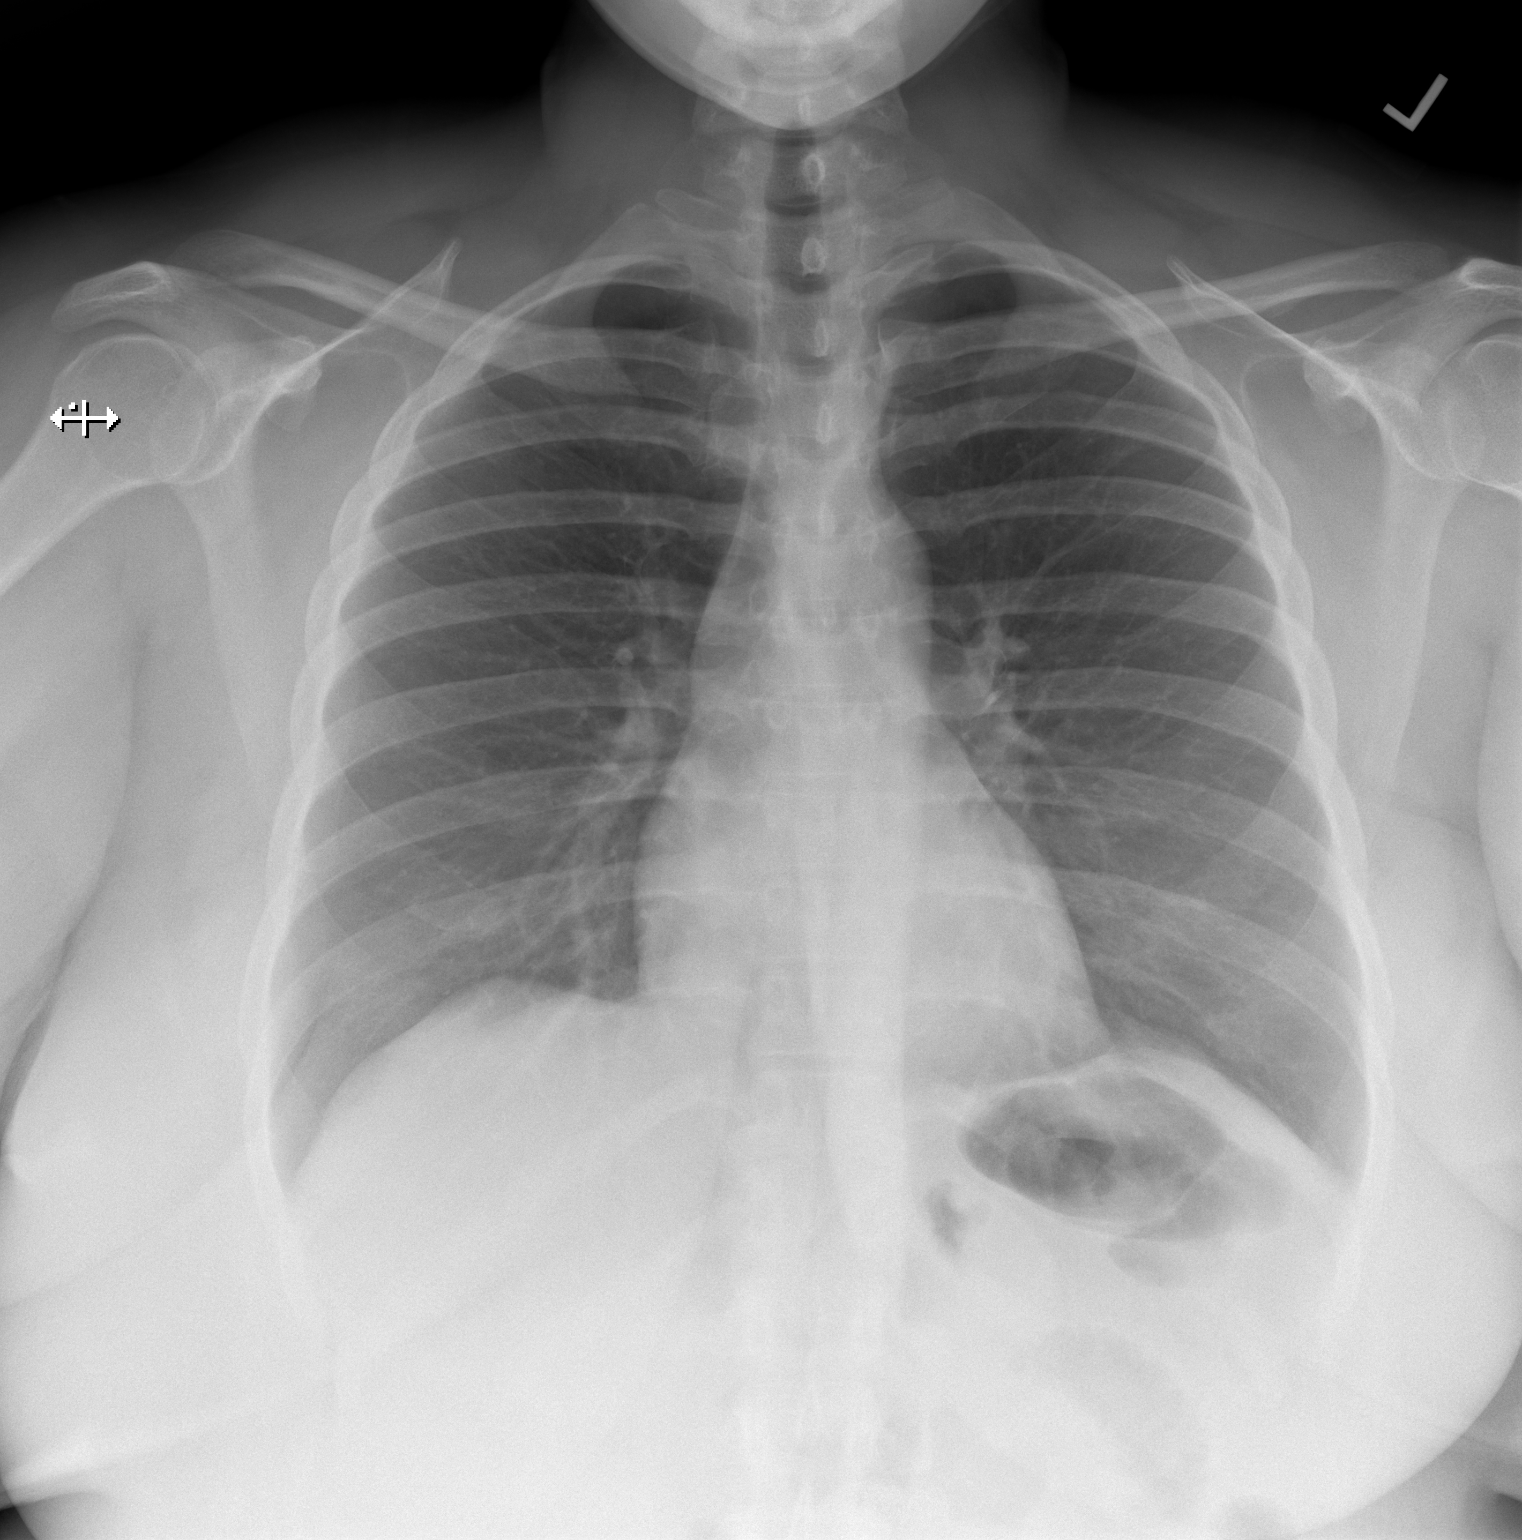

[w chest lat]
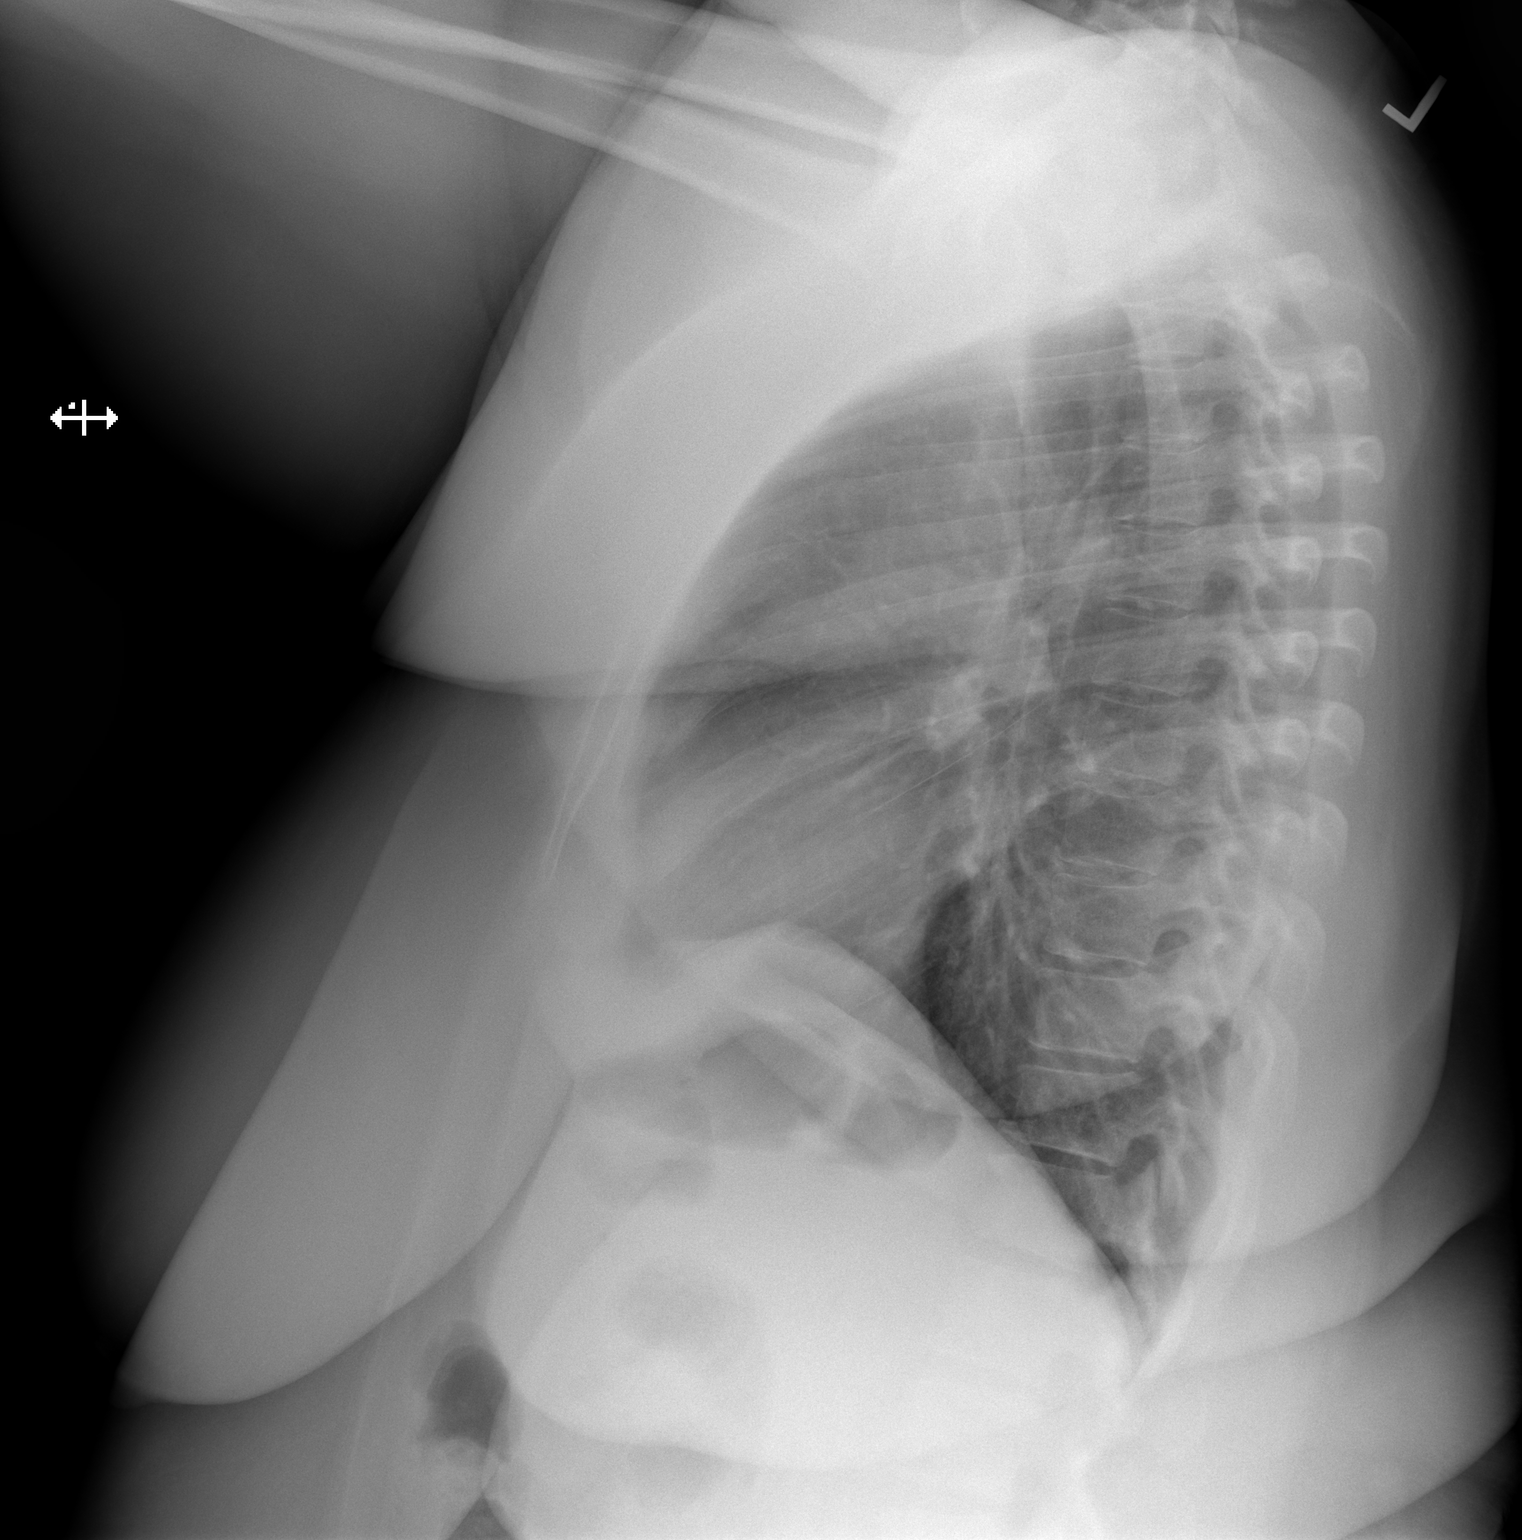

[2 of 2 positions shown; findings below may reference images not displayed]

FINDINGS: The heart size and mediastinal contours are within normal limits.
Both lungs are clear. The visualized skeletal structures are
unremarkable.
IMPRESSION: No active cardiopulmonary disease.

## 2023-06-08 ENCOUNTER — Ambulatory Visit: Payer: Self-pay | Admitting: Dermatology
